# Patient Record
Sex: Female | Born: 2000
Health system: Southern US, Community
[De-identification: ages and names within clinical notes are randomized; demographics above are authoritative.]

---
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
## 2022-03-13 ENCOUNTER — Telehealth (INDEPENDENT_AMBULATORY_CARE_PROVIDER_SITE_OTHER): Payer: BC Managed Care – PPO | Admitting: Psychiatry

## 2022-03-13 ENCOUNTER — Encounter (HOSPITAL_COMMUNITY): Payer: Self-pay | Admitting: Psychiatry

## 2022-03-13 DIAGNOSIS — F411 Generalized anxiety disorder: Secondary | ICD-10-CM

## 2022-03-13 DIAGNOSIS — F0630 Mood disorder due to known physiological condition, unspecified: Secondary | ICD-10-CM

## 2022-03-13 DIAGNOSIS — F410 Panic disorder [episodic paroxysmal anxiety] without agoraphobia: Secondary | ICD-10-CM | POA: Diagnosis not present

## 2022-03-13 NOTE — Progress Notes (Signed)
Mary Gallegos up visit  Patient Identification: Mary Gallegos MRN:  [id number] Date of Evaluation:  03/13/2022 Referral Source: Dr. Melanee Left Chief Complaint:  Gallegos up mood symptoms, anxiety Visit Diagnosis:    ICD-10-CM   1. GAD (generalized anxiety disorder)  F41.1     2. Mood disorder in conditions classified elsewhere  F06.30     3. Generalized anxiety disorder with panic attacks  F41.1    F41.0      Virtual Visit via Video Note  I connected with Mary Gallegos on 03/13/22 at 10:30 AM EST by a video enabled telemedicine application and verified that I am speaking with the correct person using two identifiers.  Location: Patient: college home Provider: home office   I discussed the limitations of evaluation and management by telemedicine and the availability of in person appointments. The patient expressed understanding and agreed to proceed.      I discussed the assessment and treatment plan with the patient. The patient was provided an opportunity to ask questions and all were answered. The patient agreed with the plan and demonstrated an understanding of the instructions.   The patient was advised to call back or seek an in-person evaluation if the symptoms worsen or if the condition fails to improve as anticipated.  I provided 20 minutes of non-face-to-face time during this encounter.      History of Present Illness: Patient is a 22 years old currently single Caucasian female who is currently living at [facility].  Referred initially by Dr. Melanee Left as she turns age 47 diagnosed with generalized anxiety disorder currently on sertraline  Doing stable on zoloft, helps with mood, anxiety Now has a home, shares with other Academic is going on well    No headaches Aggravating factors;  college but doing better now Modifying factors; parents Duration of anxiety since young age Severity; improved  Past Psychiatric History: anxiety,  depression  Previous Psychotropic Medications: Yes   Substance Abuse History in the last 12 months:  No.  Consequences of Substance Abuse: NA  Past Medical History: History reviewed. No pertinent past medical history. History reviewed. No pertinent surgical history.  Family Psychiatric History: father: anxiety  Family History: History reviewed. No pertinent family history.  Social History:   Social History   Socioeconomic History   Marital status: Single    Spouse name: Not on file   Number of children: Not on file   Years of education: Not on file   Highest education level: Not on file  Occupational History   Not on file  Tobacco Use   Smoking status: Never   Smokeless tobacco: Never  Substance and Sexual Activity   Alcohol use: Not on file   Drug use: Not on file   Sexual activity: Not on file  Other Topics Concern   Not on file  Social History Narrative   Not on file   Social Determinants of Health   Financial Resource Strain: Not on file  Food Insecurity: Not on file  Transportation Needs: Not on file  Physical Activity: Not on file  Stress: Not on file  Social Connections: Not on file     Allergies:   Allergies  Allergen Reactions   Zithromax [Azithromycin]     Metabolic Disorder Labs: No results found for: "HGBA1C", "MPG" No results found for: "PROLACTIN" No results found for: "CHOL", "TRIG", "HDL", "CHOLHDL", "VLDL", "LDLCALC" No results found for: "TSH"  Therapeutic Level Labs: No results found for: "LITHIUM" No results found for: "  CBMZ" No results found for: "VALPROATE"  Current Medications: Current Outpatient Medications  Medication Sig Dispense Refill   sertraline (ZOLOFT) 100 MG tablet Take 2 tablets (200 mg total) by mouth every morning. 60 tablet 2   No current facility-administered medications for this visit.    Psychiatric Specialty Exam: Review of Systems  Cardiovascular:  Negative for chest pain.  Neurological:  Negative  for tremors.  Psychiatric/Behavioral:  Negative for agitation and dysphoric mood.     There were no vitals taken for this visit.There is no height or weight on file to calculate BMI.  General Appearance: Casual  Eye Contact:  Fair  Speech:  Normal Rate  Volume:  Normal  Mood:  fair  Affect:  Congruent  Thought Process:  Goal Directed  Orientation:  Full (Time, Place, and Person)  Thought Content:  Rumination  Suicidal Thoughts:  No  Homicidal Thoughts:  No  Memory:  Recent;   Fair  Judgement:  Fair  Insight:  Fair  Psychomotor Activity:  Normal  Concentration:  Concentration: Fair  Recall:  Good  Fund of Knowledge:Good  Language: Good  Akathisia:  No  Handed:    AIMS (if indicated):  not done  Assets:  Housing Physical Health Social Support  ADL's:  Intact  Cognition: WNL  Sleep:  Good   Screenings: PHQ2-9    [place] Office Visit from 02/16/2021 in [place] at [facility] Video Visit from 06/01/2020 in [place] at [facility]  PHQ-2 Total Score 1 0      Flowsheet Row Video Visit from 12/05/2021 in [place] at [facility] Video Visit from 09/05/2021 in [place] at [facility] Video Visit from 06/10/2021 in [place] at [place] No Risk No Risk No Risk       Assessment and Plan: as follows  Prior documentation reviewed  Generalized anxiety disorder;improved, continue zoloft   Mood disorder unspecified: improved, continue zoloft  Not on lamictal any more  Handling depression fair as well, continue zoloft  Has counsellor available if need to Fu 48m Call for refills   NMerian Capron MD 2/12/202410:37 AM

## 2022-03-21 DIAGNOSIS — J069 Acute upper respiratory infection, unspecified: Secondary | ICD-10-CM | POA: Diagnosis not present

## 2022-05-25 DIAGNOSIS — S161XXA Strain of muscle, fascia and tendon at neck level, initial encounter: Secondary | ICD-10-CM | POA: Diagnosis not present

## 2022-05-25 DIAGNOSIS — Z113 Encounter for screening for infections with a predominantly sexual mode of transmission: Secondary | ICD-10-CM | POA: Diagnosis not present

## 2022-05-30 DIAGNOSIS — F439 Reaction to severe stress, unspecified: Secondary | ICD-10-CM | POA: Diagnosis not present

## 2022-06-06 DIAGNOSIS — F439 Reaction to severe stress, unspecified: Secondary | ICD-10-CM | POA: Diagnosis not present

## 2022-06-12 ENCOUNTER — Encounter (HOSPITAL_COMMUNITY): Payer: Self-pay

## 2022-06-12 ENCOUNTER — Encounter (HOSPITAL_COMMUNITY): Payer: Self-pay | Admitting: Psychiatry

## 2022-06-12 ENCOUNTER — Telehealth (HOSPITAL_COMMUNITY): Payer: BC Managed Care – PPO | Admitting: Psychiatry

## 2022-06-12 DIAGNOSIS — H00015 Hordeolum externum left lower eyelid: Secondary | ICD-10-CM | POA: Diagnosis not present

## 2022-06-20 DIAGNOSIS — F439 Reaction to severe stress, unspecified: Secondary | ICD-10-CM | POA: Diagnosis not present

## 2022-07-05 ENCOUNTER — Telehealth (HOSPITAL_COMMUNITY): Payer: Self-pay | Admitting: *Deleted

## 2022-07-05 ENCOUNTER — Other Ambulatory Visit (HOSPITAL_COMMUNITY): Payer: Self-pay | Admitting: *Deleted

## 2022-07-05 MED ORDER — SERTRALINE HCL 100 MG PO TABS: 200.0000 mg | ORAL_TABLET | Freq: Every morning | ORAL | 2 refills | Status: AC

## 2022-07-05 NOTE — Telephone Encounter (Signed)
CALLED PATIENT  & MOM ANSWERED --INFORMED THAT CALLING ABOUT A REFILL REQUEST & THERE ARE NO CURRENT APPT THAT LAST SEEN 03/13/22.  MOM STATED HER DAUGHTER WILL START SEEING ANOTHER PSYCHIATRIST ON 07/2622.  BUT ASKED FOR A BRIDGE OF 2 WEEKS DUE THAT HER DAUGHTER WILL BE OUT OF MEDICATION THIS FRIDAY.    sertraline (ZOLOFT) 100 MG tablet [[id number]]   Order Details  Dose: 200 mg Route: Oral Frequency: Every morning  Dispense Quantity: 60 tablet Refills: 2        Sig: Take 2 tablets (200 mg total) by mouth every morning.       LAST APPT 03-13-22

## 2022-07-21 DIAGNOSIS — F411 Generalized anxiety disorder: Secondary | ICD-10-CM | POA: Diagnosis not present

## 2022-07-21 DIAGNOSIS — F1190 Opioid use, unspecified, uncomplicated: Secondary | ICD-10-CM | POA: Diagnosis not present

## 2022-07-21 DIAGNOSIS — F3132 Bipolar disorder, current episode depressed, moderate: Secondary | ICD-10-CM | POA: Diagnosis not present

## 2022-08-07 DIAGNOSIS — N760 Acute vaginitis: Secondary | ICD-10-CM | POA: Diagnosis not present

## 2022-08-07 DIAGNOSIS — Z113 Encounter for screening for infections with a predominantly sexual mode of transmission: Secondary | ICD-10-CM | POA: Diagnosis not present

## 2022-08-07 DIAGNOSIS — Z01419 Encounter for gynecological examination (general) (routine) without abnormal findings: Secondary | ICD-10-CM | POA: Diagnosis not present

## 2022-08-07 DIAGNOSIS — Z6820 Body mass index (BMI) 20.0-20.9, adult: Secondary | ICD-10-CM | POA: Diagnosis not present

## 2022-09-04 DIAGNOSIS — F3132 Bipolar disorder, current episode depressed, moderate: Secondary | ICD-10-CM | POA: Diagnosis not present

## 2022-09-04 DIAGNOSIS — F411 Generalized anxiety disorder: Secondary | ICD-10-CM | POA: Diagnosis not present

## 2022-09-04 DIAGNOSIS — F4310 Post-traumatic stress disorder, unspecified: Secondary | ICD-10-CM | POA: Diagnosis not present

## 2022-09-29 DIAGNOSIS — F3132 Bipolar disorder, current episode depressed, moderate: Secondary | ICD-10-CM | POA: Diagnosis not present

## 2022-09-29 DIAGNOSIS — F411 Generalized anxiety disorder: Secondary | ICD-10-CM | POA: Diagnosis not present

## 2022-09-29 DIAGNOSIS — F4310 Post-traumatic stress disorder, unspecified: Secondary | ICD-10-CM | POA: Diagnosis not present

## 2022-10-30 DIAGNOSIS — R300 Dysuria: Secondary | ICD-10-CM | POA: Diagnosis not present

## 2022-10-30 DIAGNOSIS — N898 Other specified noninflammatory disorders of vagina: Secondary | ICD-10-CM | POA: Diagnosis not present

## 2022-11-27 DIAGNOSIS — F3132 Bipolar disorder, current episode depressed, moderate: Secondary | ICD-10-CM | POA: Diagnosis not present

## 2022-11-27 DIAGNOSIS — F411 Generalized anxiety disorder: Secondary | ICD-10-CM | POA: Diagnosis not present

## 2022-11-27 DIAGNOSIS — F4310 Post-traumatic stress disorder, unspecified: Secondary | ICD-10-CM | POA: Diagnosis not present

## 2022-12-25 DIAGNOSIS — F411 Generalized anxiety disorder: Secondary | ICD-10-CM | POA: Diagnosis not present

## 2022-12-25 DIAGNOSIS — F4310 Post-traumatic stress disorder, unspecified: Secondary | ICD-10-CM | POA: Diagnosis not present

## 2022-12-25 DIAGNOSIS — F3132 Bipolar disorder, current episode depressed, moderate: Secondary | ICD-10-CM | POA: Diagnosis not present
